# Patient Record
Sex: Female | Born: 1945 | Race: White | Hispanic: No | Marital: Married | Smoking: Never smoker
Health system: Southern US, Community
[De-identification: ages and names within clinical notes are randomized; demographics above are authoritative.]

## PROBLEM LIST (undated history)

## (undated) HISTORY — PX: BREAST CYST ASPIRATION: SHX578

---

## 2004-10-25 ENCOUNTER — Ambulatory Visit: Payer: Self-pay | Admitting: Family Medicine

## 2005-10-29 ENCOUNTER — Ambulatory Visit: Payer: Self-pay | Admitting: Family Medicine

## 2007-02-04 ENCOUNTER — Ambulatory Visit: Payer: Self-pay | Admitting: Family Medicine

## 2007-04-20 ENCOUNTER — Ambulatory Visit: Payer: Self-pay | Admitting: Gastroenterology

## 2008-03-02 ENCOUNTER — Ambulatory Visit: Payer: Self-pay | Admitting: Family Medicine

## 2009-03-06 ENCOUNTER — Ambulatory Visit: Payer: Self-pay | Admitting: Family Medicine

## 2010-03-07 ENCOUNTER — Ambulatory Visit: Payer: Self-pay | Admitting: Family Medicine

## 2011-03-27 ENCOUNTER — Ambulatory Visit: Payer: Self-pay | Admitting: Family Medicine

## 2012-04-01 ENCOUNTER — Ambulatory Visit: Payer: Self-pay | Admitting: Family Medicine

## 2012-09-21 ENCOUNTER — Ambulatory Visit: Payer: Self-pay | Admitting: Gastroenterology

## 2012-09-22 LAB — PATHOLOGY REPORT

## 2012-12-29 ENCOUNTER — Ambulatory Visit: Payer: Self-pay | Admitting: Family Medicine

## 2013-04-02 ENCOUNTER — Ambulatory Visit: Payer: Self-pay | Admitting: Family Medicine

## 2014-04-04 ENCOUNTER — Ambulatory Visit: Payer: Self-pay | Admitting: Family Medicine

## 2014-04-04 DIAGNOSIS — Z1231 Encounter for screening mammogram for malignant neoplasm of breast: Secondary | ICD-10-CM | POA: Diagnosis not present

## 2014-06-09 DIAGNOSIS — M545 Low back pain: Secondary | ICD-10-CM | POA: Diagnosis not present

## 2014-06-30 DIAGNOSIS — R05 Cough: Secondary | ICD-10-CM | POA: Diagnosis not present

## 2014-08-09 DIAGNOSIS — E784 Other hyperlipidemia: Secondary | ICD-10-CM | POA: Diagnosis not present

## 2014-08-09 DIAGNOSIS — I10 Essential (primary) hypertension: Secondary | ICD-10-CM | POA: Diagnosis not present

## 2014-08-10 DIAGNOSIS — N390 Urinary tract infection, site not specified: Secondary | ICD-10-CM | POA: Diagnosis not present

## 2014-08-16 DIAGNOSIS — I10 Essential (primary) hypertension: Secondary | ICD-10-CM | POA: Diagnosis not present

## 2014-08-16 DIAGNOSIS — E785 Hyperlipidemia, unspecified: Secondary | ICD-10-CM | POA: Diagnosis not present

## 2014-08-16 DIAGNOSIS — Z0000 Encounter for general adult medical examination without abnormal findings: Secondary | ICD-10-CM | POA: Diagnosis not present

## 2015-03-27 ENCOUNTER — Other Ambulatory Visit: Payer: Self-pay | Admitting: Family Medicine

## 2015-03-27 DIAGNOSIS — Z1231 Encounter for screening mammogram for malignant neoplasm of breast: Secondary | ICD-10-CM

## 2015-04-05 ENCOUNTER — Ambulatory Visit
Admission: RE | Admit: 2015-04-05 | Discharge: 2015-04-05 | Disposition: A | Discharge status: Home / Self Care | Payer: Commercial Managed Care - HMO | Source: Ambulatory Visit | Attending: Family Medicine | Admitting: Family Medicine

## 2015-04-05 DIAGNOSIS — Z1231 Encounter for screening mammogram for malignant neoplasm of breast: Secondary | ICD-10-CM | POA: Insufficient documentation

## 2015-04-27 DIAGNOSIS — R300 Dysuria: Secondary | ICD-10-CM | POA: Diagnosis not present

## 2015-06-07 DIAGNOSIS — R300 Dysuria: Secondary | ICD-10-CM | POA: Diagnosis not present

## 2015-08-08 DIAGNOSIS — E785 Hyperlipidemia, unspecified: Secondary | ICD-10-CM | POA: Diagnosis not present

## 2015-08-08 DIAGNOSIS — I10 Essential (primary) hypertension: Secondary | ICD-10-CM | POA: Diagnosis not present

## 2015-08-08 DIAGNOSIS — Z0000 Encounter for general adult medical examination without abnormal findings: Secondary | ICD-10-CM | POA: Diagnosis not present

## 2015-08-09 DIAGNOSIS — N390 Urinary tract infection, site not specified: Secondary | ICD-10-CM | POA: Diagnosis not present

## 2015-10-12 DIAGNOSIS — Z0000 Encounter for general adult medical examination without abnormal findings: Secondary | ICD-10-CM | POA: Diagnosis not present

## 2015-12-26 DIAGNOSIS — R300 Dysuria: Secondary | ICD-10-CM | POA: Diagnosis not present

## 2016-02-27 ENCOUNTER — Other Ambulatory Visit: Payer: Self-pay | Admitting: Family Medicine

## 2016-02-27 DIAGNOSIS — Z1231 Encounter for screening mammogram for malignant neoplasm of breast: Secondary | ICD-10-CM

## 2016-04-05 ENCOUNTER — Ambulatory Visit
Admission: RE | Admit: 2016-04-05 | Discharge: 2016-04-05 | Disposition: A | Discharge status: Home / Self Care | Payer: Commercial Managed Care - HMO | Source: Ambulatory Visit | Attending: Family Medicine | Admitting: Family Medicine

## 2016-04-05 DIAGNOSIS — Z1231 Encounter for screening mammogram for malignant neoplasm of breast: Secondary | ICD-10-CM | POA: Diagnosis not present

## 2016-06-26 DIAGNOSIS — R399 Unspecified symptoms and signs involving the genitourinary system: Secondary | ICD-10-CM | POA: Diagnosis not present

## 2016-06-26 DIAGNOSIS — R350 Frequency of micturition: Secondary | ICD-10-CM | POA: Diagnosis not present

## 2016-07-23 DIAGNOSIS — R300 Dysuria: Secondary | ICD-10-CM | POA: Diagnosis not present

## 2016-10-10 DIAGNOSIS — Z0000 Encounter for general adult medical examination without abnormal findings: Secondary | ICD-10-CM | POA: Diagnosis not present

## 2016-10-11 DIAGNOSIS — R3989 Other symptoms and signs involving the genitourinary system: Secondary | ICD-10-CM | POA: Diagnosis not present

## 2016-10-17 DIAGNOSIS — I10 Essential (primary) hypertension: Secondary | ICD-10-CM | POA: Diagnosis not present

## 2016-10-17 DIAGNOSIS — E785 Hyperlipidemia, unspecified: Secondary | ICD-10-CM | POA: Diagnosis not present

## 2016-10-17 DIAGNOSIS — Z0000 Encounter for general adult medical examination without abnormal findings: Secondary | ICD-10-CM | POA: Diagnosis not present

## 2016-11-22 DIAGNOSIS — Z86010 Personal history of colonic polyps: Secondary | ICD-10-CM | POA: Diagnosis not present

## 2016-12-27 DIAGNOSIS — K640 First degree hemorrhoids: Secondary | ICD-10-CM | POA: Diagnosis not present

## 2016-12-27 DIAGNOSIS — K5730 Diverticulosis of large intestine without perforation or abscess without bleeding: Secondary | ICD-10-CM | POA: Diagnosis not present

## 2016-12-27 DIAGNOSIS — Z86010 Personal history of colonic polyps: Secondary | ICD-10-CM | POA: Diagnosis not present

## 2016-12-27 DIAGNOSIS — D120 Benign neoplasm of cecum: Secondary | ICD-10-CM | POA: Diagnosis not present

## 2016-12-27 DIAGNOSIS — D126 Benign neoplasm of colon, unspecified: Secondary | ICD-10-CM | POA: Diagnosis not present

## 2016-12-27 DIAGNOSIS — K648 Other hemorrhoids: Secondary | ICD-10-CM | POA: Diagnosis not present

## 2016-12-27 DIAGNOSIS — K635 Polyp of colon: Secondary | ICD-10-CM | POA: Diagnosis not present

## 2017-03-18 ENCOUNTER — Other Ambulatory Visit: Payer: Self-pay | Admitting: Family Medicine

## 2017-03-18 DIAGNOSIS — Z1231 Encounter for screening mammogram for malignant neoplasm of breast: Secondary | ICD-10-CM

## 2017-04-08 ENCOUNTER — Ambulatory Visit
Admission: RE | Admit: 2017-04-08 | Discharge: 2017-04-08 | Disposition: A | Discharge status: Home / Self Care | Payer: Medicare HMO | Source: Ambulatory Visit | Attending: Family Medicine | Admitting: Family Medicine

## 2017-04-08 DIAGNOSIS — Z1231 Encounter for screening mammogram for malignant neoplasm of breast: Secondary | ICD-10-CM | POA: Diagnosis not present

## 2017-04-17 DIAGNOSIS — H2513 Age-related nuclear cataract, bilateral: Secondary | ICD-10-CM | POA: Diagnosis not present

## 2017-05-23 DIAGNOSIS — N390 Urinary tract infection, site not specified: Secondary | ICD-10-CM | POA: Diagnosis not present

## 2017-05-23 DIAGNOSIS — A499 Bacterial infection, unspecified: Secondary | ICD-10-CM | POA: Diagnosis not present

## 2017-05-23 DIAGNOSIS — R350 Frequency of micturition: Secondary | ICD-10-CM | POA: Diagnosis not present

## 2017-10-17 DIAGNOSIS — I10 Essential (primary) hypertension: Secondary | ICD-10-CM | POA: Diagnosis not present

## 2017-10-17 DIAGNOSIS — E785 Hyperlipidemia, unspecified: Secondary | ICD-10-CM | POA: Diagnosis not present

## 2017-10-21 DIAGNOSIS — Z636 Dependent relative needing care at home: Secondary | ICD-10-CM | POA: Diagnosis not present

## 2017-10-21 DIAGNOSIS — Z0000 Encounter for general adult medical examination without abnormal findings: Secondary | ICD-10-CM | POA: Diagnosis not present

## 2017-12-04 DIAGNOSIS — Z636 Dependent relative needing care at home: Secondary | ICD-10-CM | POA: Diagnosis not present

## 2017-12-04 DIAGNOSIS — F438 Other reactions to severe stress: Secondary | ICD-10-CM | POA: Diagnosis not present

## 2018-03-23 ENCOUNTER — Other Ambulatory Visit: Payer: Self-pay | Admitting: Family Medicine

## 2018-03-23 DIAGNOSIS — Z1231 Encounter for screening mammogram for malignant neoplasm of breast: Secondary | ICD-10-CM

## 2018-06-01 ENCOUNTER — Ambulatory Visit
Admission: RE | Admit: 2018-06-01 | Discharge: 2018-06-01 | Disposition: A | Discharge status: Home / Self Care | Payer: Medicare HMO | Source: Ambulatory Visit | Attending: Family Medicine | Admitting: Family Medicine

## 2018-06-01 ENCOUNTER — Other Ambulatory Visit: Payer: Self-pay

## 2018-06-01 DIAGNOSIS — Z1231 Encounter for screening mammogram for malignant neoplasm of breast: Secondary | ICD-10-CM | POA: Diagnosis not present

## 2018-06-10 ENCOUNTER — Other Ambulatory Visit: Payer: Self-pay | Admitting: Family Medicine

## 2018-06-10 DIAGNOSIS — N6320 Unspecified lump in the left breast, unspecified quadrant: Secondary | ICD-10-CM

## 2018-06-10 DIAGNOSIS — N6310 Unspecified lump in the right breast, unspecified quadrant: Secondary | ICD-10-CM

## 2018-06-10 DIAGNOSIS — R928 Other abnormal and inconclusive findings on diagnostic imaging of breast: Secondary | ICD-10-CM

## 2018-06-25 ENCOUNTER — Ambulatory Visit
Admission: RE | Admit: 2018-06-25 | Discharge: 2018-06-25 | Disposition: A | Discharge status: Home / Self Care | Payer: Medicare HMO | Source: Ambulatory Visit | Attending: Family Medicine | Admitting: Family Medicine

## 2018-06-25 ENCOUNTER — Other Ambulatory Visit: Payer: Self-pay

## 2018-06-25 DIAGNOSIS — N6312 Unspecified lump in the right breast, upper inner quadrant: Secondary | ICD-10-CM | POA: Diagnosis not present

## 2018-06-25 DIAGNOSIS — N6310 Unspecified lump in the right breast, unspecified quadrant: Secondary | ICD-10-CM | POA: Diagnosis not present

## 2018-06-25 DIAGNOSIS — N6320 Unspecified lump in the left breast, unspecified quadrant: Secondary | ICD-10-CM

## 2018-06-25 DIAGNOSIS — R928 Other abnormal and inconclusive findings on diagnostic imaging of breast: Secondary | ICD-10-CM | POA: Diagnosis not present

## 2018-06-25 DIAGNOSIS — N6322 Unspecified lump in the left breast, upper inner quadrant: Secondary | ICD-10-CM | POA: Diagnosis not present

## 2018-06-29 ENCOUNTER — Other Ambulatory Visit: Payer: Self-pay | Admitting: Family Medicine

## 2018-06-29 DIAGNOSIS — N6310 Unspecified lump in the right breast, unspecified quadrant: Secondary | ICD-10-CM

## 2018-06-29 DIAGNOSIS — R928 Other abnormal and inconclusive findings on diagnostic imaging of breast: Secondary | ICD-10-CM

## 2018-07-02 ENCOUNTER — Other Ambulatory Visit: Payer: Self-pay

## 2018-07-02 ENCOUNTER — Ambulatory Visit
Admission: RE | Admit: 2018-07-02 | Discharge: 2018-07-02 | Disposition: A | Discharge status: Home / Self Care | Payer: Medicare HMO | Source: Ambulatory Visit | Attending: Family Medicine | Admitting: Family Medicine

## 2018-07-02 DIAGNOSIS — N6310 Unspecified lump in the right breast, unspecified quadrant: Secondary | ICD-10-CM

## 2018-07-02 DIAGNOSIS — N641 Fat necrosis of breast: Secondary | ICD-10-CM | POA: Diagnosis not present

## 2018-07-02 DIAGNOSIS — R928 Other abnormal and inconclusive findings on diagnostic imaging of breast: Secondary | ICD-10-CM | POA: Diagnosis not present

## 2018-07-02 DIAGNOSIS — N6312 Unspecified lump in the right breast, upper inner quadrant: Secondary | ICD-10-CM | POA: Diagnosis not present

## 2018-07-02 HISTORY — PX: BREAST BIOPSY: SHX20

## 2018-07-03 LAB — SURGICAL PATHOLOGY

## 2018-12-23 DIAGNOSIS — I10 Essential (primary) hypertension: Secondary | ICD-10-CM | POA: Diagnosis not present

## 2018-12-23 DIAGNOSIS — Z0000 Encounter for general adult medical examination without abnormal findings: Secondary | ICD-10-CM | POA: Diagnosis not present

## 2018-12-23 DIAGNOSIS — E785 Hyperlipidemia, unspecified: Secondary | ICD-10-CM | POA: Diagnosis not present

## 2018-12-30 DIAGNOSIS — E785 Hyperlipidemia, unspecified: Secondary | ICD-10-CM | POA: Diagnosis not present

## 2018-12-30 DIAGNOSIS — R8290 Unspecified abnormal findings in urine: Secondary | ICD-10-CM | POA: Diagnosis not present

## 2018-12-30 DIAGNOSIS — R030 Elevated blood-pressure reading, without diagnosis of hypertension: Secondary | ICD-10-CM | POA: Diagnosis not present

## 2018-12-30 DIAGNOSIS — Z0000 Encounter for general adult medical examination without abnormal findings: Secondary | ICD-10-CM | POA: Diagnosis not present

## 2018-12-30 DIAGNOSIS — F439 Reaction to severe stress, unspecified: Secondary | ICD-10-CM | POA: Diagnosis not present

## 2019-04-12 DIAGNOSIS — N390 Urinary tract infection, site not specified: Secondary | ICD-10-CM | POA: Diagnosis not present

## 2019-04-12 DIAGNOSIS — R399 Unspecified symptoms and signs involving the genitourinary system: Secondary | ICD-10-CM | POA: Diagnosis not present

## 2019-04-12 DIAGNOSIS — R319 Hematuria, unspecified: Secondary | ICD-10-CM | POA: Diagnosis not present

## 2019-04-20 ENCOUNTER — Other Ambulatory Visit: Payer: Self-pay | Admitting: Family Medicine

## 2019-04-20 DIAGNOSIS — Z1231 Encounter for screening mammogram for malignant neoplasm of breast: Secondary | ICD-10-CM

## 2019-06-02 ENCOUNTER — Ambulatory Visit
Admission: RE | Admit: 2019-06-02 | Discharge: 2019-06-02 | Disposition: A | Discharge status: Home / Self Care | Payer: Medicare HMO | Source: Ambulatory Visit | Attending: Family Medicine | Admitting: Family Medicine

## 2019-06-02 DIAGNOSIS — Z1231 Encounter for screening mammogram for malignant neoplasm of breast: Secondary | ICD-10-CM | POA: Diagnosis not present

## 2019-12-28 DIAGNOSIS — Z0000 Encounter for general adult medical examination without abnormal findings: Secondary | ICD-10-CM | POA: Diagnosis not present

## 2019-12-28 DIAGNOSIS — R030 Elevated blood-pressure reading, without diagnosis of hypertension: Secondary | ICD-10-CM | POA: Diagnosis not present

## 2019-12-28 DIAGNOSIS — E785 Hyperlipidemia, unspecified: Secondary | ICD-10-CM | POA: Diagnosis not present

## 2019-12-29 DIAGNOSIS — R8290 Unspecified abnormal findings in urine: Secondary | ICD-10-CM | POA: Diagnosis not present

## 2020-01-03 DIAGNOSIS — I10 Essential (primary) hypertension: Secondary | ICD-10-CM | POA: Diagnosis not present

## 2020-01-03 DIAGNOSIS — R8290 Unspecified abnormal findings in urine: Secondary | ICD-10-CM | POA: Diagnosis not present

## 2020-01-03 DIAGNOSIS — E785 Hyperlipidemia, unspecified: Secondary | ICD-10-CM | POA: Diagnosis not present

## 2020-01-03 DIAGNOSIS — Z0000 Encounter for general adult medical examination without abnormal findings: Secondary | ICD-10-CM | POA: Diagnosis not present

## 2020-03-28 ENCOUNTER — Other Ambulatory Visit: Payer: Self-pay | Admitting: Family Medicine

## 2020-03-28 DIAGNOSIS — Z1231 Encounter for screening mammogram for malignant neoplasm of breast: Secondary | ICD-10-CM

## 2020-05-15 IMAGING — US ULTRASOUND RIGHT BREAST LIMITED
1 series · 8 of 8 positions shown · non-contrast
Comparison: 04/08/2017 and earlier

ACR Breast Density Category a: The breast tissue is almost entirely
fatty.

CLINICAL DATA: Patient returns after screening study for evaluation
of possible mass in the RIGHT breast and a possible mass in the LEFT
breast.

EXAM:
DIGITAL DIAGNOSTIC BILATERAL MAMMOGRAM WITH CAD AND TOMO
ULTRASOUND BILATERAL BREAST

[Series 1: ultrasound right breast limited · 0.08mm/px · 8 of 8 slices shown]
[im 1/8]
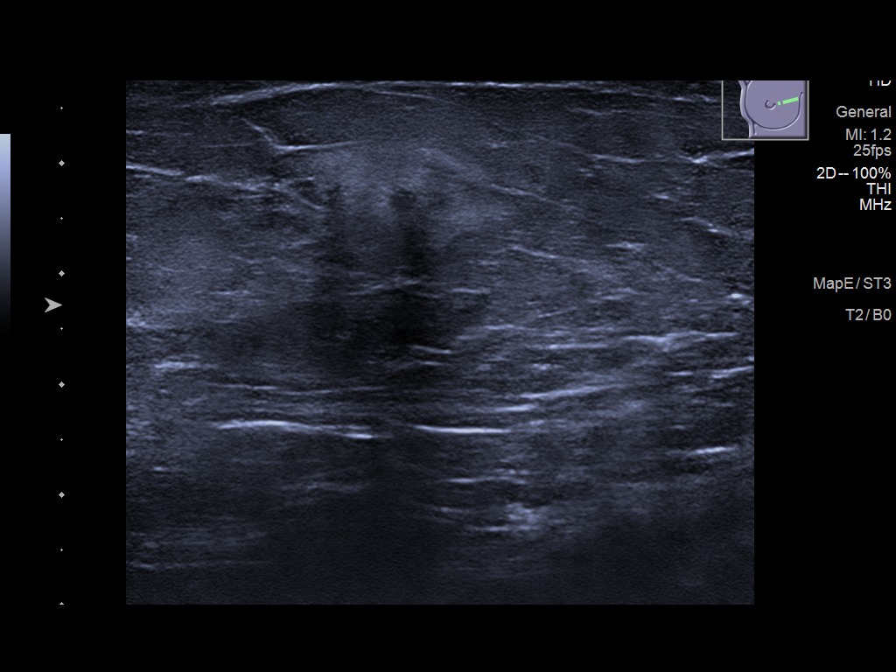
[im 2/8]
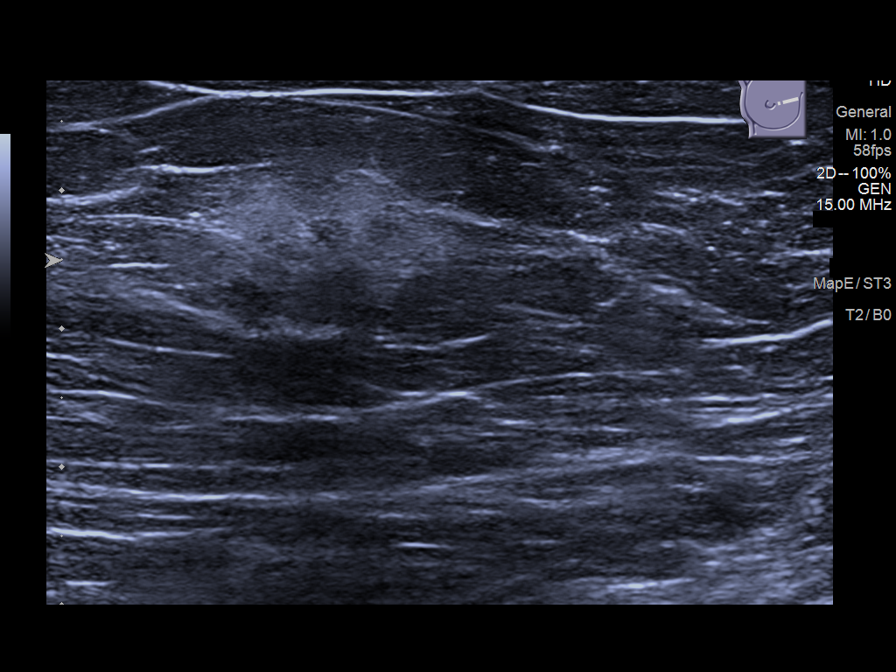
[im 3/8]
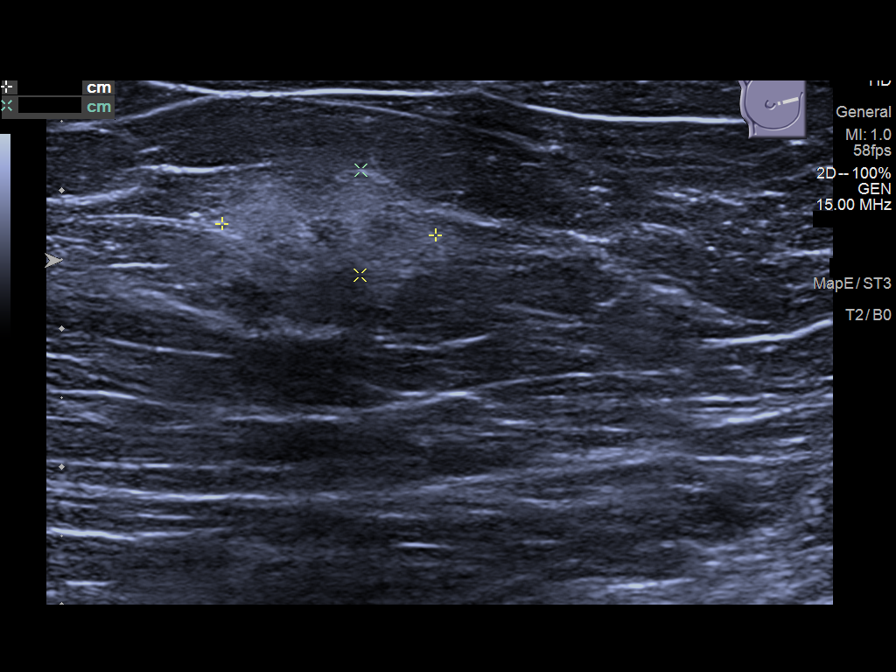
[im 4/8]
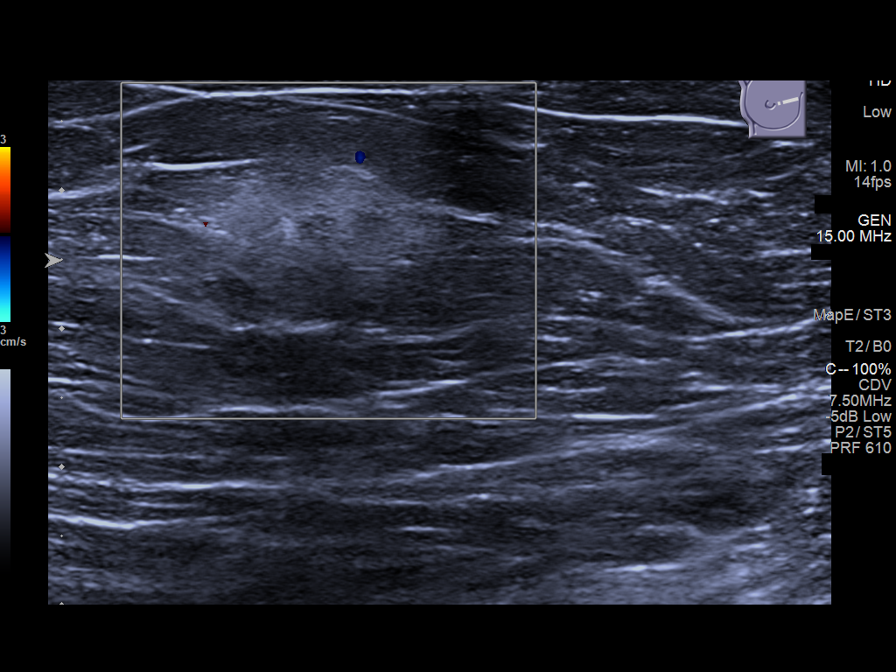
[im 5/8]
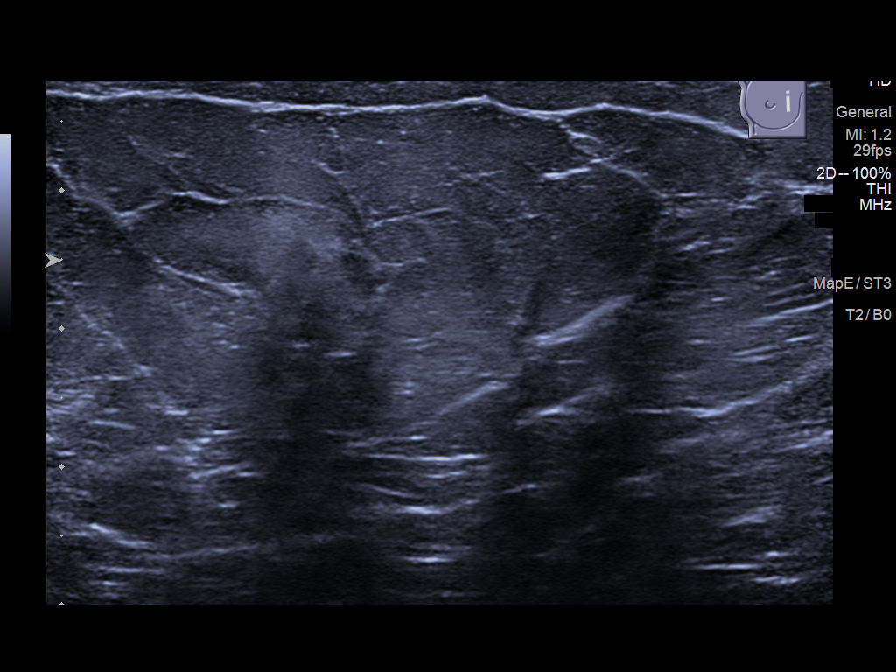
[im 6/8]
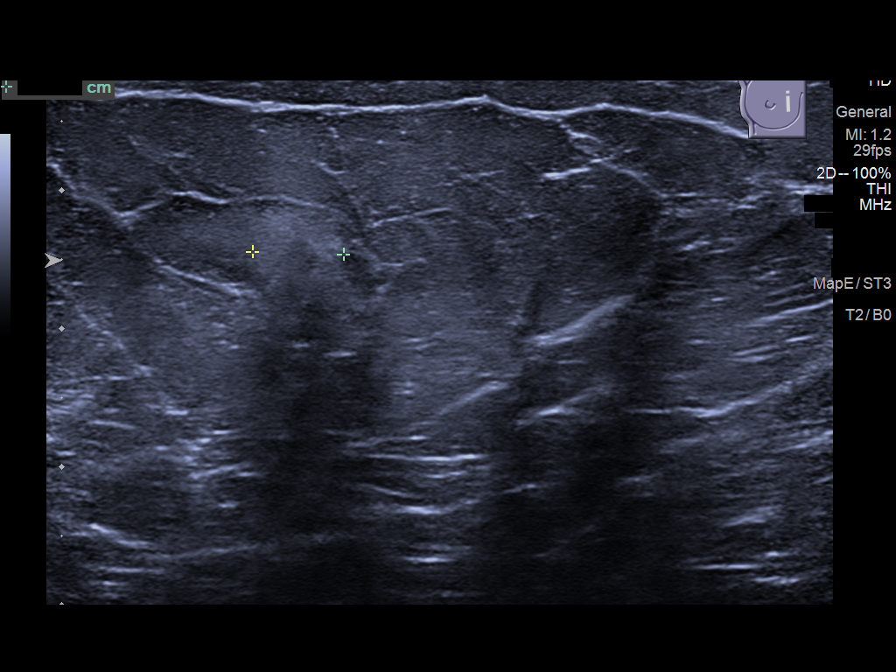
[im 7/8]
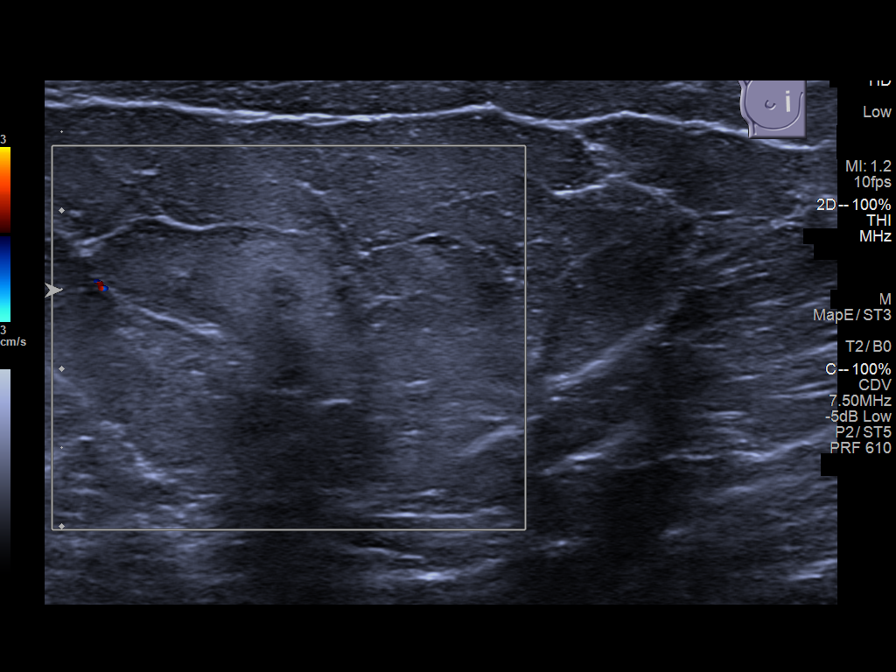
[im 8/8]
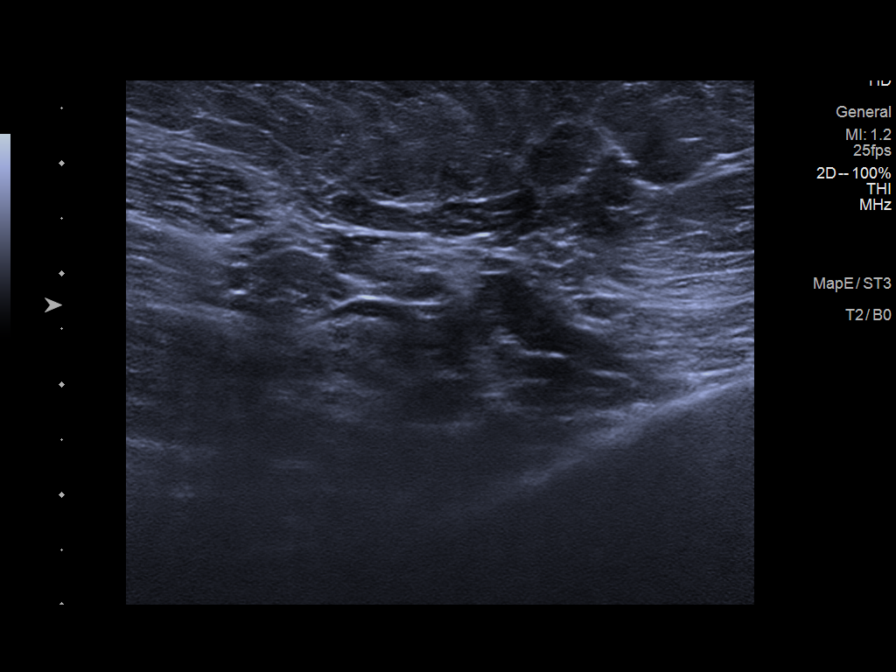

[8 of 8 positions shown; findings below may reference images not displayed]

FINDINGS: Right breast:

Mammogram: Additional 2-D and 3-D images are performed. Spot
compression views confirm presence of an irregular mass with
irregular margins in the MEDIAL portion of the RIGHT breast and
further evaluated with ultrasound. Mammographic images were
processed with CAD.

Physical Exam: There is no visible ecchymosis in the MEDIAL aspect
of the RIGHT breast. Patient denies any known trauma.

Ultrasound: Targeted ultrasound is performed, showing an
irregularly-shaped hyperechoic mass with hypo echoic center and
irregular margins in the 2:30 o'clock location RIGHT breast 9
centimeters from the nipple. There is no significant internal blood
flow. Mass measures 1.6 x 0.8 x 0.7 centimeters and is identified at
1.5 centimeters depth in the breast. Evaluation of the RIGHT axilla
is negative for adenopathy.

Left breast:

Mammogram: Additional 2-D and 3-D images are performed. These views
confirm presence of 2 low-attenuation rounded masses in the MEDIAL
aspect of the LEFT breast, each with lucent center and partial rim
calcification. Mammographic images were processed with CAD.

Ultrasound: Targeted ultrasound is performed, showing a small
hypoechoic oval mass in the 10 o'clock location of the LEFT breast 6
centimeters from the nipple which measures 0.5 centimeters. A second
similar mass is identified in the 10 o'clock location 1 centimeter
from the nipple measuring 0.4 centimeters. No associated internal
blood flow identified with either lesion.
IMPRESSION: 1. Indeterminate mass in the 2:30 o'clock location of the RIGHT
breast, possibly representing fat necrosis. However malignancy is
not excluded and biopsy is indicated.
2. RIGHT axilla is negative.
3. Typical changes of fat necrosis in the 10 o'clock location of the
LEFT breast. No evidence for LEFT breast malignancy.

RECOMMENDATION:
Recommend ultrasound-guided core biopsy of mass in the 2:30 o'clock
location of the RIGHT breast.

I have discussed the findings and recommendations with the patient.
Results were also provided in writing at the conclusion of the
visit. If applicable, a reminder letter will be sent to the patient
regarding the next appointment.

BI-RADS CATEGORY  4: Suspicious.

## 2020-05-15 IMAGING — MG DIGITAL DIAGNOSTIC BILATERAL MAMMOGRAM WITH TOMO AND CAD
6 of 12 series · 6 of 36 positions shown · non-contrast
Comparison: 04/08/2017 and earlier

ACR Breast Density Category a: The breast tissue is almost entirely
fatty.

CLINICAL DATA: Patient returns after screening study for evaluation
of possible mass in the RIGHT breast and a possible mass in the LEFT
breast.

EXAM:
DIGITAL DIAGNOSTIC BILATERAL MAMMOGRAM WITH CAD AND TOMO
ULTRASOUND BILATERAL BREAST

[L ML synth-2D]
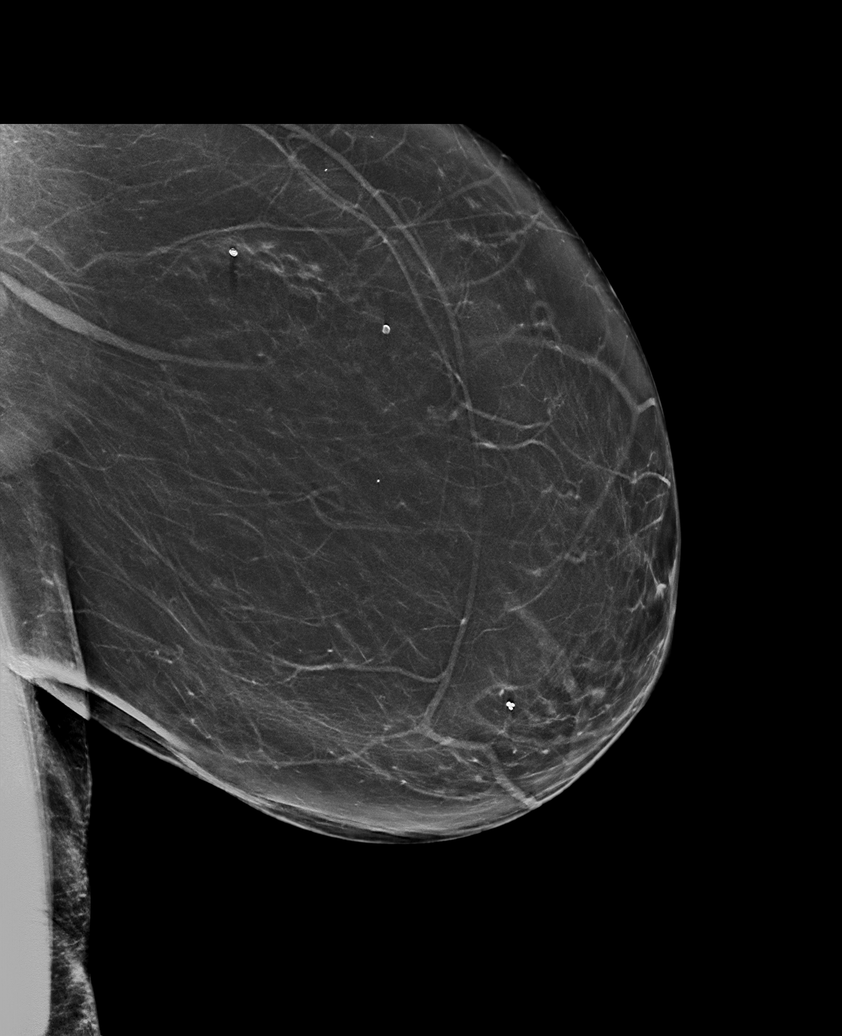

[L CC synth-2D (1 of 3)]
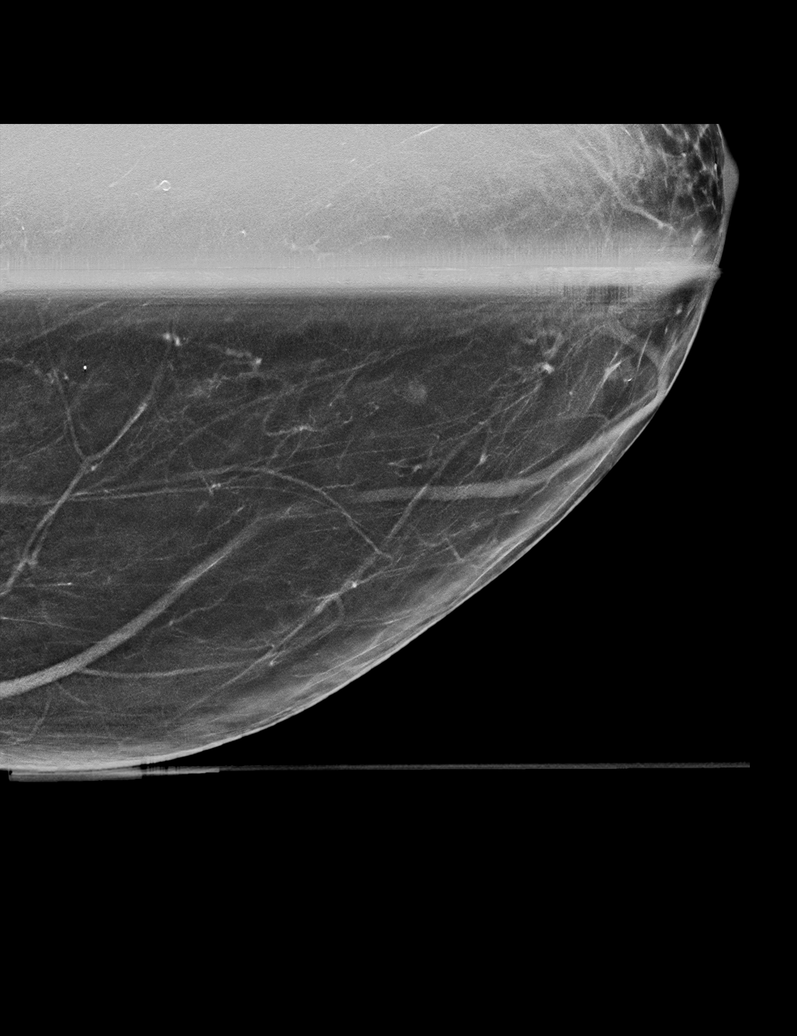

[L CC synth-2D (2 of 3)]
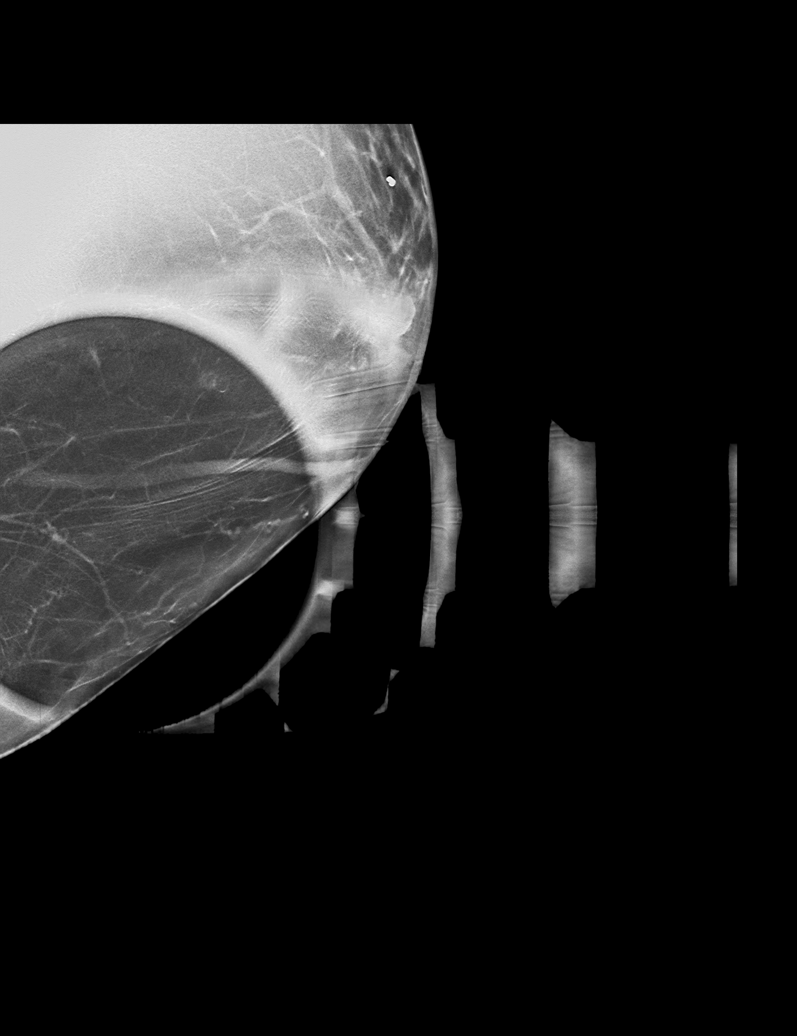

[R CC synth-2D]
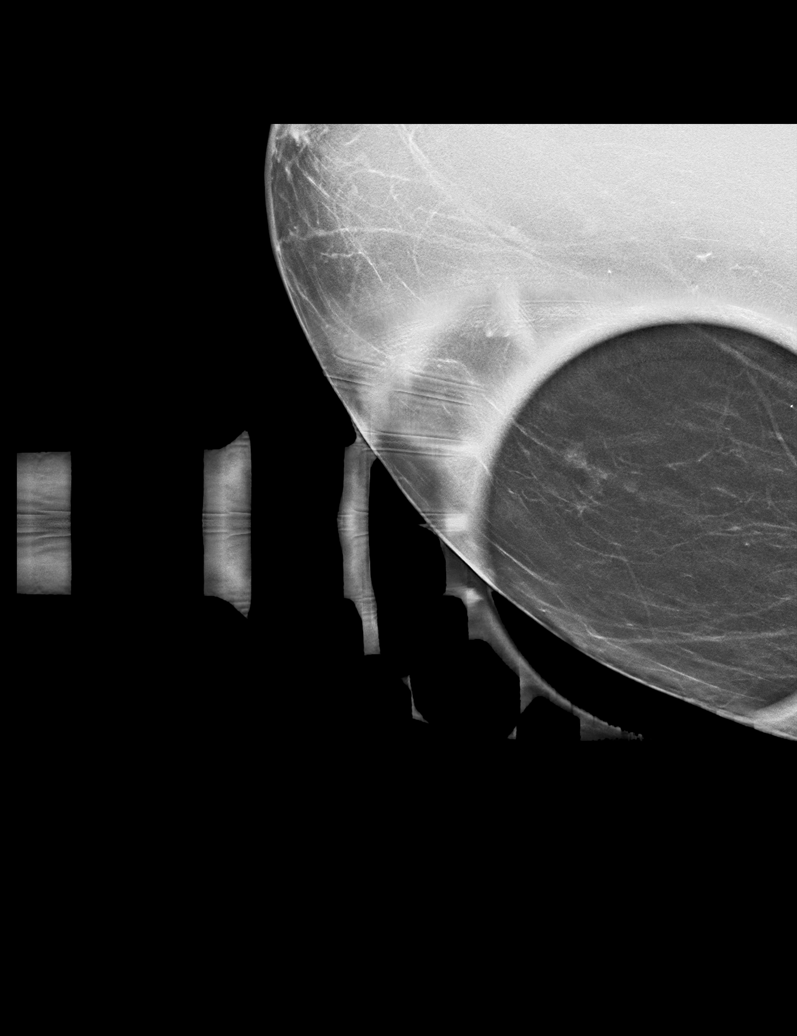

[L CC synth-2D (3 of 3)]
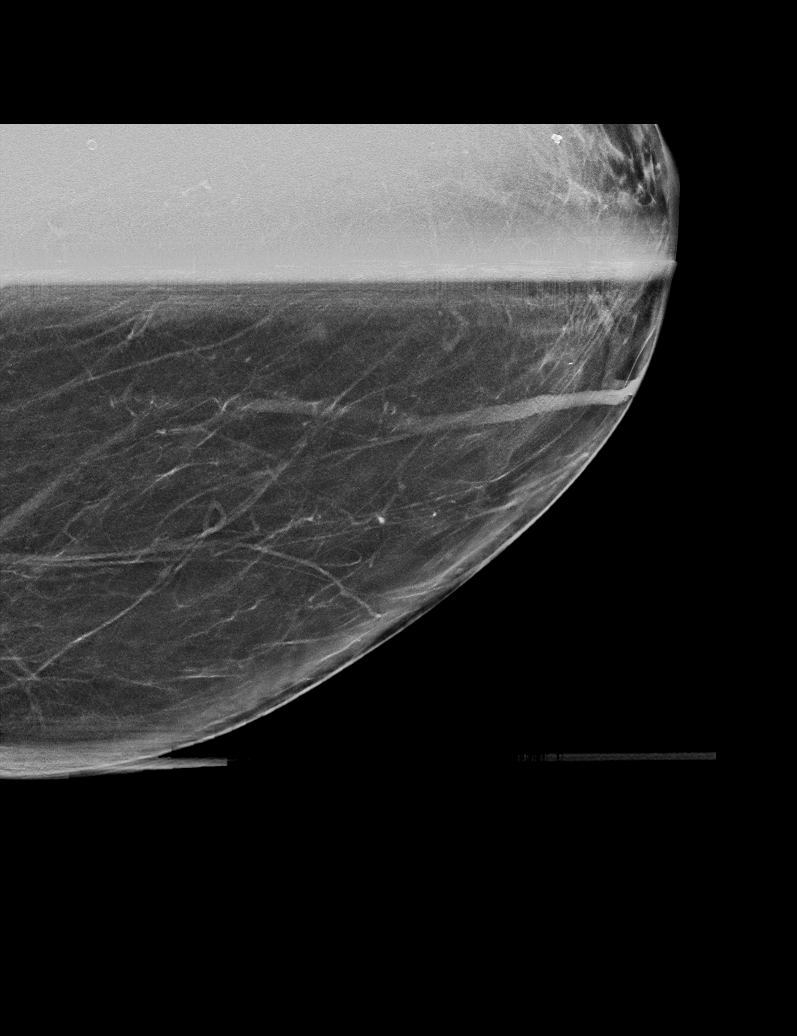

[R ML synth-2D]
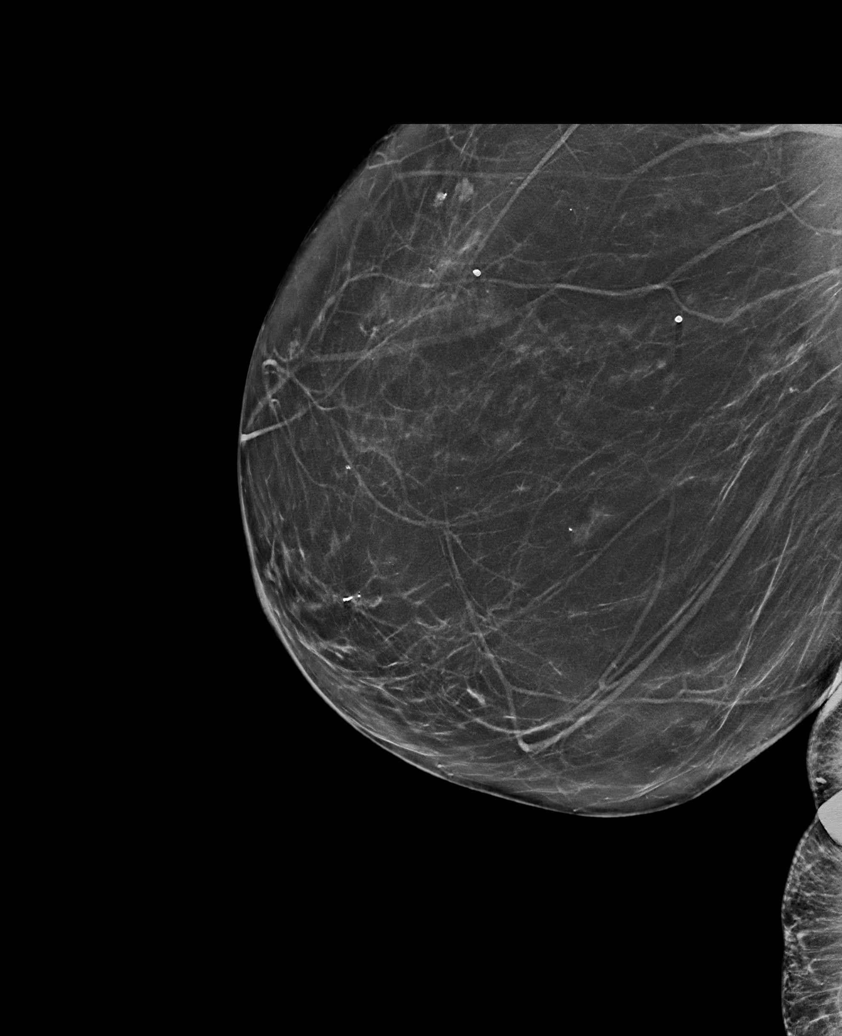

[6 of 36 positions shown; findings below may reference images not displayed]

FINDINGS: Right breast:

Mammogram: Additional 2-D and 3-D images are performed. Spot
compression views confirm presence of an irregular mass with
irregular margins in the MEDIAL portion of the RIGHT breast and
further evaluated with ultrasound. Mammographic images were
processed with CAD.

Physical Exam: There is no visible ecchymosis in the MEDIAL aspect
of the RIGHT breast. Patient denies any known trauma.

Ultrasound: Targeted ultrasound is performed, showing an
irregularly-shaped hyperechoic mass with hypo echoic center and
irregular margins in the 2:30 o'clock location RIGHT breast 9
centimeters from the nipple. There is no significant internal blood
flow. Mass measures 1.6 x 0.8 x 0.7 centimeters and is identified at
1.5 centimeters depth in the breast. Evaluation of the RIGHT axilla
is negative for adenopathy.

Left breast:

Mammogram: Additional 2-D and 3-D images are performed. These views
confirm presence of 2 low-attenuation rounded masses in the MEDIAL
aspect of the LEFT breast, each with lucent center and partial rim
calcification. Mammographic images were processed with CAD.

Ultrasound: Targeted ultrasound is performed, showing a small
hypoechoic oval mass in the 10 o'clock location of the LEFT breast 6
centimeters from the nipple which measures 0.5 centimeters. A second
similar mass is identified in the 10 o'clock location 1 centimeter
from the nipple measuring 0.4 centimeters. No associated internal
blood flow identified with either lesion.
IMPRESSION: 1. Indeterminate mass in the 2:30 o'clock location of the RIGHT
breast, possibly representing fat necrosis. However malignancy is
not excluded and biopsy is indicated.
2. RIGHT axilla is negative.
3. Typical changes of fat necrosis in the 10 o'clock location of the
LEFT breast. No evidence for LEFT breast malignancy.

RECOMMENDATION:
Recommend ultrasound-guided core biopsy of mass in the 2:30 o'clock
location of the RIGHT breast.

I have discussed the findings and recommendations with the patient.
Results were also provided in writing at the conclusion of the
visit. If applicable, a reminder letter will be sent to the patient
regarding the next appointment.

BI-RADS CATEGORY  4: Suspicious.

## 2020-05-22 IMAGING — MG MM CLIP PLACEMENT
2 series · 2 of 2 positions shown · non-contrast
Comparison: Previous exam(s).

CLINICAL DATA: Ultrasound-guided core needle biopsy was performed
of a mass in the upper inner right breast.

EXAM:
DIAGNOSTIC RIGHT MAMMOGRAM POST ULTRASOUND BIOPSY

[R CC]
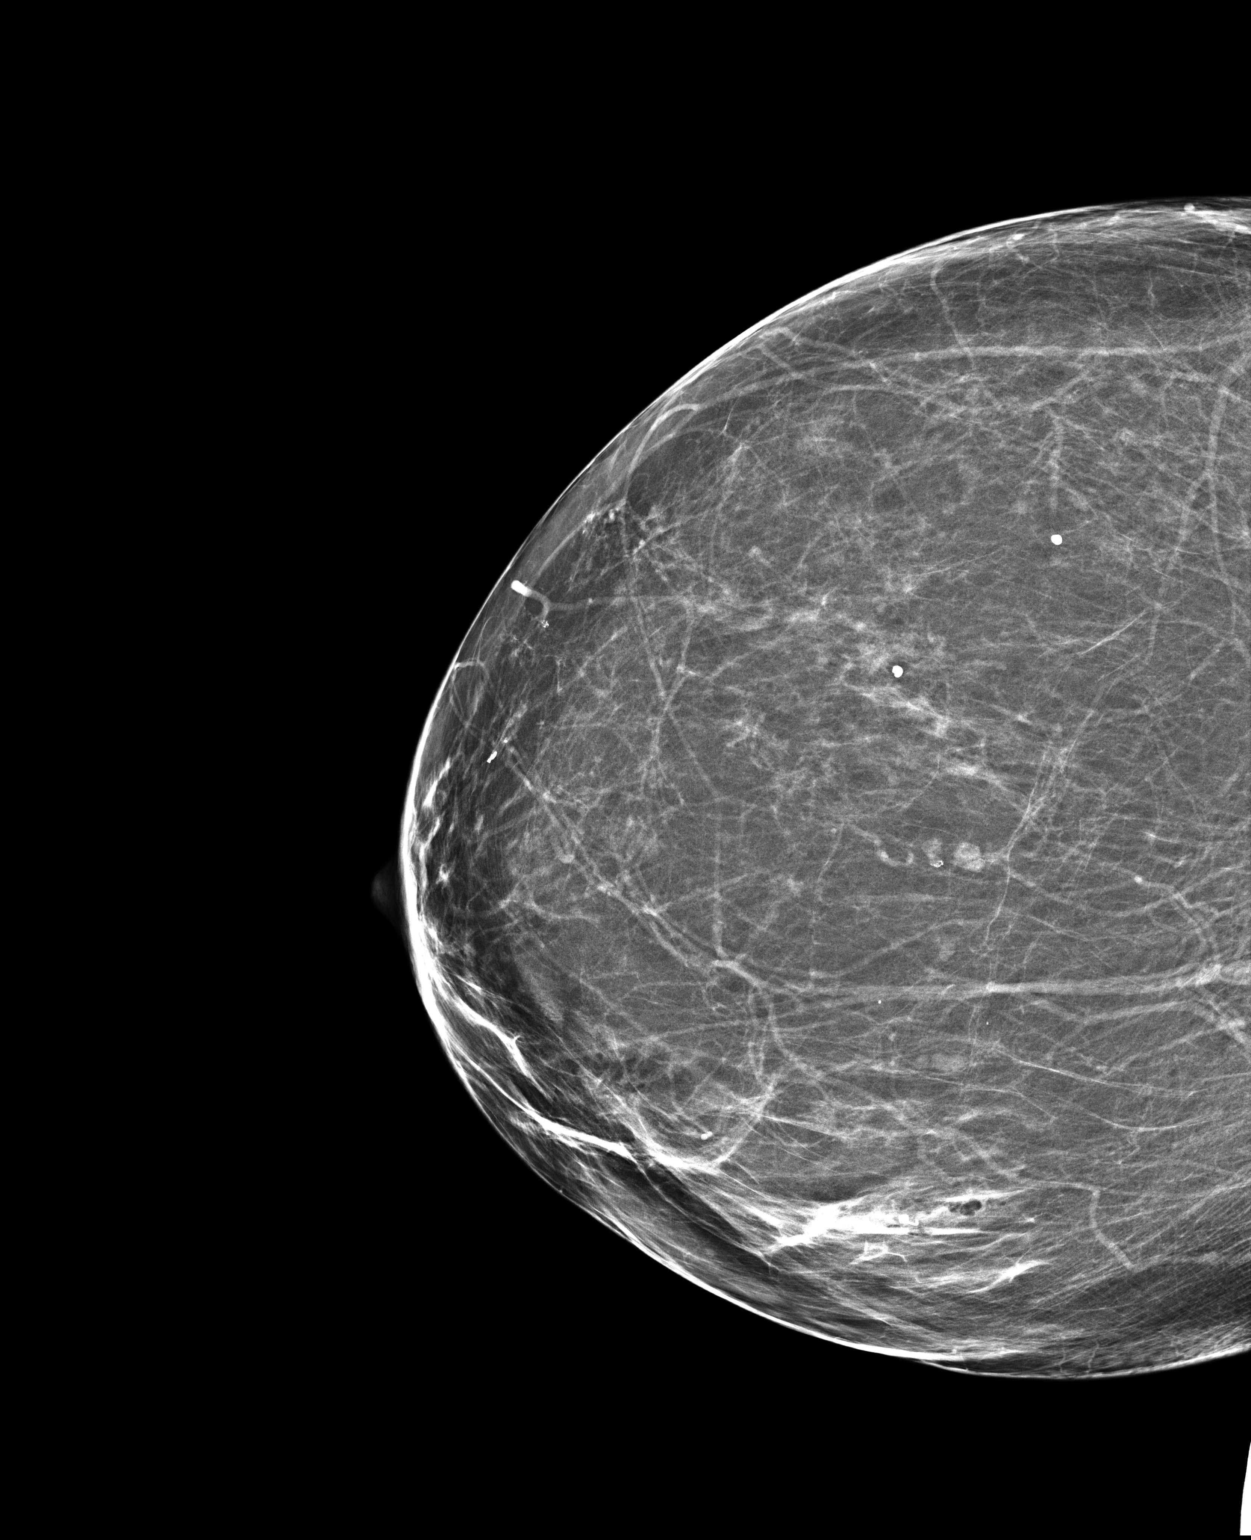

[R ML]
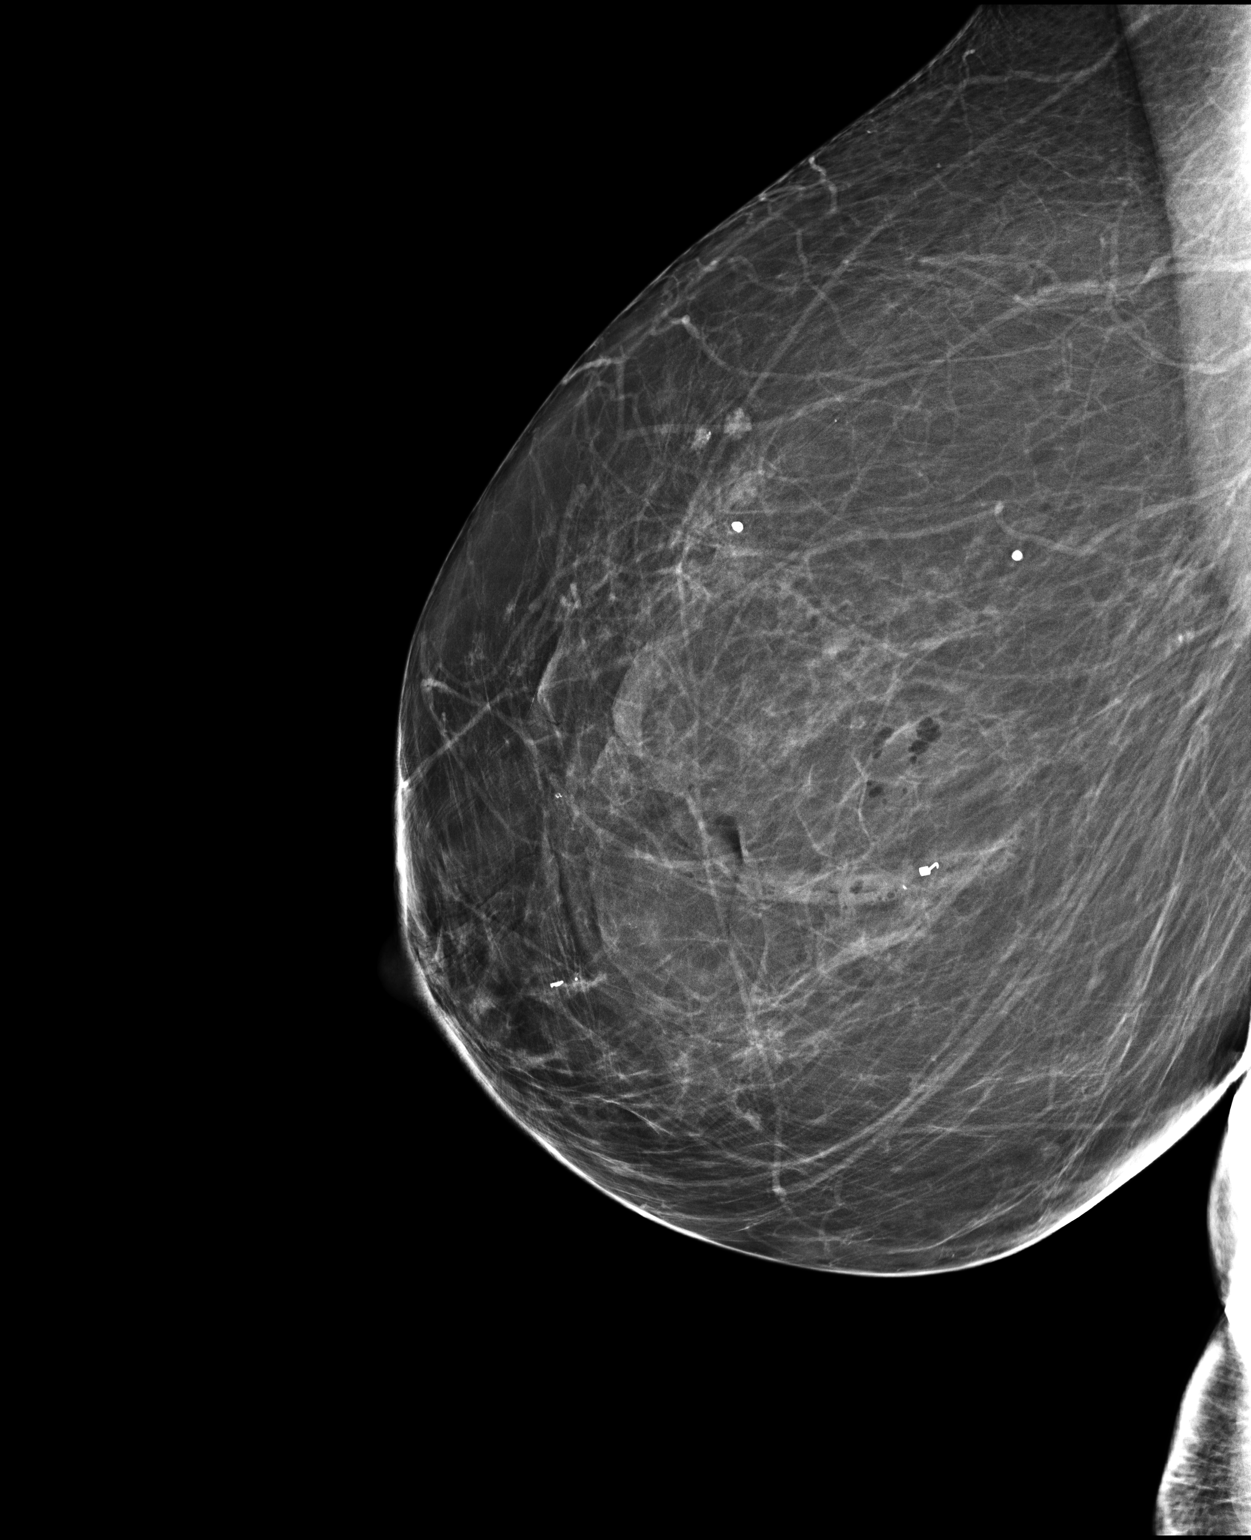

[2 of 2 positions shown; findings below may reference images not displayed]

FINDINGS: Mammographic images were obtained following ultrasound guided biopsy
of a right breast mass at [DATE] position 9 cm from the nipple. A coil
shaped biopsy clip is in satisfactory position.
IMPRESSION: Satisfactory position of coil shaped biopsy clip at the site of the
biopsied mass.

Final Assessment: Post Procedure Mammograms for Marker Placement

## 2020-06-06 ENCOUNTER — Ambulatory Visit
Admission: RE | Admit: 2020-06-06 | Discharge: 2020-06-06 | Disposition: A | Discharge status: Home / Self Care | Payer: Medicare Other | Source: Ambulatory Visit | Attending: Family Medicine | Admitting: Family Medicine

## 2020-06-06 ENCOUNTER — Other Ambulatory Visit: Payer: Self-pay

## 2020-06-06 DIAGNOSIS — Z1231 Encounter for screening mammogram for malignant neoplasm of breast: Secondary | ICD-10-CM | POA: Insufficient documentation

## 2021-02-16 DIAGNOSIS — H353131 Nonexudative age-related macular degeneration, bilateral, early dry stage: Secondary | ICD-10-CM | POA: Diagnosis not present

## 2021-04-27 ENCOUNTER — Other Ambulatory Visit: Payer: Self-pay | Admitting: Family Medicine

## 2021-04-27 DIAGNOSIS — Z1231 Encounter for screening mammogram for malignant neoplasm of breast: Secondary | ICD-10-CM

## 2021-06-07 ENCOUNTER — Ambulatory Visit
Admission: RE | Admit: 2021-06-07 | Discharge: 2021-06-07 | Disposition: A | Discharge status: Home / Self Care | Payer: PPO | Source: Ambulatory Visit | Attending: Family Medicine | Admitting: Family Medicine

## 2021-06-07 DIAGNOSIS — Z1231 Encounter for screening mammogram for malignant neoplasm of breast: Secondary | ICD-10-CM | POA: Diagnosis not present

## 2021-06-08 DIAGNOSIS — H353131 Nonexudative age-related macular degeneration, bilateral, early dry stage: Secondary | ICD-10-CM | POA: Diagnosis not present

## 2022-01-08 DIAGNOSIS — E785 Hyperlipidemia, unspecified: Secondary | ICD-10-CM | POA: Diagnosis not present

## 2022-01-08 DIAGNOSIS — I10 Essential (primary) hypertension: Secondary | ICD-10-CM | POA: Diagnosis not present

## 2022-01-10 DIAGNOSIS — R8290 Unspecified abnormal findings in urine: Secondary | ICD-10-CM | POA: Diagnosis not present

## 2022-01-15 DIAGNOSIS — Z1211 Encounter for screening for malignant neoplasm of colon: Secondary | ICD-10-CM | POA: Diagnosis not present

## 2022-01-15 DIAGNOSIS — Z23 Encounter for immunization: Secondary | ICD-10-CM | POA: Diagnosis not present

## 2022-01-15 DIAGNOSIS — I10 Essential (primary) hypertension: Secondary | ICD-10-CM | POA: Diagnosis not present

## 2022-01-15 DIAGNOSIS — Z0000 Encounter for general adult medical examination without abnormal findings: Secondary | ICD-10-CM | POA: Diagnosis not present

## 2022-01-15 DIAGNOSIS — E785 Hyperlipidemia, unspecified: Secondary | ICD-10-CM | POA: Diagnosis not present

## 2022-01-15 DIAGNOSIS — F439 Reaction to severe stress, unspecified: Secondary | ICD-10-CM | POA: Diagnosis not present

## 2022-04-30 DIAGNOSIS — Z86010 Personal history of colonic polyps: Secondary | ICD-10-CM | POA: Diagnosis not present

## 2022-04-30 DIAGNOSIS — Z01818 Encounter for other preprocedural examination: Secondary | ICD-10-CM | POA: Diagnosis not present

## 2022-05-01 ENCOUNTER — Other Ambulatory Visit: Payer: Self-pay | Admitting: Family Medicine

## 2022-05-01 DIAGNOSIS — Z1231 Encounter for screening mammogram for malignant neoplasm of breast: Secondary | ICD-10-CM

## 2022-05-23 ENCOUNTER — Ambulatory Visit: Payer: PPO

## 2022-05-23 DIAGNOSIS — D125 Benign neoplasm of sigmoid colon: Secondary | ICD-10-CM | POA: Diagnosis not present

## 2022-05-23 DIAGNOSIS — K5730 Diverticulosis of large intestine without perforation or abscess without bleeding: Secondary | ICD-10-CM | POA: Diagnosis not present

## 2022-05-23 DIAGNOSIS — K640 First degree hemorrhoids: Secondary | ICD-10-CM | POA: Diagnosis not present

## 2022-05-23 DIAGNOSIS — Z86010 Personal history of colonic polyps: Secondary | ICD-10-CM | POA: Diagnosis not present

## 2022-05-23 DIAGNOSIS — Z09 Encounter for follow-up examination after completed treatment for conditions other than malignant neoplasm: Secondary | ICD-10-CM | POA: Diagnosis not present

## 2022-06-05 DIAGNOSIS — K1121 Acute sialoadenitis: Secondary | ICD-10-CM | POA: Diagnosis not present

## 2022-06-11 ENCOUNTER — Ambulatory Visit
Admission: RE | Admit: 2022-06-11 | Discharge: 2022-06-11 | Disposition: A | Discharge status: Home / Self Care | Payer: PPO | Source: Ambulatory Visit | Attending: Family Medicine | Admitting: Family Medicine

## 2022-06-11 DIAGNOSIS — Z1231 Encounter for screening mammogram for malignant neoplasm of breast: Secondary | ICD-10-CM | POA: Diagnosis not present

## 2022-08-18 ENCOUNTER — Other Ambulatory Visit: Payer: Self-pay

## 2022-08-18 ENCOUNTER — Ambulatory Visit
Admission: EM | Admit: 2022-08-18 | Discharge: 2022-08-18 | Disposition: A | Discharge status: Home / Self Care | Payer: PPO

## 2022-08-18 ENCOUNTER — Encounter: Payer: Self-pay | Admitting: *Deleted

## 2022-08-18 DIAGNOSIS — H6123 Impacted cerumen, bilateral: Secondary | ICD-10-CM | POA: Diagnosis not present

## 2022-08-18 NOTE — ED Provider Notes (Signed)
Renaldo Fiddler    CSN: [id number] Arrival date & time: 08/18/22  1153      History   Chief Complaint Chief Complaint  Patient presents with   Ear Fullness    HPI Allison Snow is a 77 y.o. female with R ear clogged since this am. Has noticed decreased hearing in both for a few weeks. She is prone to getting wax buildup and has to have it lavaged a few times a year. She denies pain in her ears.     History reviewed. No pertinent past medical history.  There are no problems to display for this patient.   Past Surgical History:  Procedure Laterality Date   BREAST BIOPSY Right 07/02/2018   ORGANIZING FAT NECROSIS   BREAST CYST ASPIRATION Right 20+ yrs ago   neg    OB History   No obstetric history on file.      Home Medications    Prior to Admission medications   Not on File    Family History Family History  Problem Relation Age of Onset   Breast cancer Neg Hx     Social History Social History   Tobacco Use   Smoking status: Never   Smokeless tobacco: Never     Allergies   Latex and Penicillins   Review of Systems Review of Systems As noted in HPI  Physical Exam Triage Vital Signs ED Triage Vitals  Encounter Vitals Group     BP 08/18/22 1242 (!) 163/98     Systolic BP Percentile --      Diastolic BP Percentile --      Pulse Rate 08/18/22 1242 (!) 58     Resp 08/18/22 1242 18     Temp 08/18/22 1242 98.3 F (36.8 C)     Temp src --      SpO2 08/18/22 1242 98 %     Weight --      Height --      Head Circumference --      Peak Flow --      Pain Score 08/18/22 1237 0     Pain Loc --      Pain Education --      Exclude from Growth Chart --    No data found.  Updated Vital Signs BP (!) 163/98   Pulse (!) 58   Temp 98.3 F (36.8 C)   Resp 18   SpO2 98%   Visual Acuity Right Eye Distance:   Left Eye Distance:   Bilateral Distance:    Right Eye Near:   Left Eye Near:    Bilateral Near:     Physical Exam   UC  Treatments / Results  Labs (all labs ordered are listed, but only abnormal results are displayed) Labs Reviewed - No data to display  EKG   Radiology No results found.  Procedures Procedures (including critical care time)  Medications Ordered in UC Medications - No data to display  Initial Impression / Assessment and Plan / UC Course  I have reviewed the triage vital signs and the nursing notes. Bilateral ear lavage was attempted, and she could not tolerate it having done on the L. From the R side only a little wax was washed.   Cerumen impaction  Advised to use debrox every day for a week and FU with pcp for lavage in 9 days.    Final Clinical Impressions(s) / UC Diagnoses   Final diagnoses:  Bilateral hearing loss due to cerumen  impaction     Discharge Instructions      Soak your ear canals with Debrox drops to soften the wax left behind and see your primary care provider to have it removed.      ED Prescriptions   None    PDMP not reviewed this encounter.   Garey Ham, PA-C 08/18/22 1326

## 2022-08-18 NOTE — ED Triage Notes (Signed)
Pt reports she has decreased hearing because her ears are full.

## 2022-08-18 NOTE — Discharge Instructions (Signed)
Soak your ear canals with Debrox drops to soften the wax left behind and see your primary care provider to have it removed.

## 2023-02-17 DIAGNOSIS — M170 Bilateral primary osteoarthritis of knee: Secondary | ICD-10-CM | POA: Diagnosis not present

## 2023-03-03 DIAGNOSIS — M170 Bilateral primary osteoarthritis of knee: Secondary | ICD-10-CM | POA: Diagnosis not present

## 2023-03-17 DIAGNOSIS — I10 Essential (primary) hypertension: Secondary | ICD-10-CM | POA: Diagnosis not present

## 2023-03-17 DIAGNOSIS — E785 Hyperlipidemia, unspecified: Secondary | ICD-10-CM | POA: Diagnosis not present

## 2023-03-20 DIAGNOSIS — R8290 Unspecified abnormal findings in urine: Secondary | ICD-10-CM | POA: Diagnosis not present

## 2023-03-24 DIAGNOSIS — G8929 Other chronic pain: Secondary | ICD-10-CM | POA: Diagnosis not present

## 2023-03-24 DIAGNOSIS — H6123 Impacted cerumen, bilateral: Secondary | ICD-10-CM | POA: Diagnosis not present

## 2023-03-24 DIAGNOSIS — I10 Essential (primary) hypertension: Secondary | ICD-10-CM | POA: Diagnosis not present

## 2023-03-24 DIAGNOSIS — N390 Urinary tract infection, site not specified: Secondary | ICD-10-CM | POA: Diagnosis not present

## 2023-03-24 DIAGNOSIS — Z0000 Encounter for general adult medical examination without abnormal findings: Secondary | ICD-10-CM | POA: Diagnosis not present

## 2023-03-24 DIAGNOSIS — E785 Hyperlipidemia, unspecified: Secondary | ICD-10-CM | POA: Diagnosis not present

## 2023-03-24 DIAGNOSIS — F439 Reaction to severe stress, unspecified: Secondary | ICD-10-CM | POA: Diagnosis not present

## 2023-03-24 DIAGNOSIS — M25561 Pain in right knee: Secondary | ICD-10-CM | POA: Diagnosis not present

## 2023-03-24 DIAGNOSIS — Z636 Dependent relative needing care at home: Secondary | ICD-10-CM | POA: Diagnosis not present

## 2023-03-26 DIAGNOSIS — M25561 Pain in right knee: Secondary | ICD-10-CM | POA: Diagnosis not present

## 2023-03-26 DIAGNOSIS — G8929 Other chronic pain: Secondary | ICD-10-CM | POA: Diagnosis not present

## 2023-03-26 DIAGNOSIS — M1711 Unilateral primary osteoarthritis, right knee: Secondary | ICD-10-CM | POA: Diagnosis not present

## 2023-03-28 DIAGNOSIS — H6123 Impacted cerumen, bilateral: Secondary | ICD-10-CM | POA: Diagnosis not present

## 2023-04-23 DIAGNOSIS — N390 Urinary tract infection, site not specified: Secondary | ICD-10-CM | POA: Diagnosis not present

## 2023-04-24 DIAGNOSIS — M1711 Unilateral primary osteoarthritis, right knee: Secondary | ICD-10-CM | POA: Diagnosis not present

## 2023-05-01 DIAGNOSIS — M1711 Unilateral primary osteoarthritis, right knee: Secondary | ICD-10-CM | POA: Diagnosis not present

## 2023-05-02 ENCOUNTER — Other Ambulatory Visit: Payer: Self-pay | Admitting: Family Medicine

## 2023-05-02 DIAGNOSIS — Z1231 Encounter for screening mammogram for malignant neoplasm of breast: Secondary | ICD-10-CM

## 2023-05-08 DIAGNOSIS — M1711 Unilateral primary osteoarthritis, right knee: Secondary | ICD-10-CM | POA: Diagnosis not present

## 2023-05-14 DIAGNOSIS — Z87440 Personal history of urinary (tract) infections: Secondary | ICD-10-CM | POA: Diagnosis not present

## 2023-06-12 ENCOUNTER — Ambulatory Visit
Admission: RE | Admit: 2023-06-12 | Discharge: 2023-06-12 | Disposition: A | Discharge status: Home / Self Care | Source: Ambulatory Visit | Attending: Family Medicine | Admitting: Family Medicine

## 2023-06-12 DIAGNOSIS — Z1231 Encounter for screening mammogram for malignant neoplasm of breast: Secondary | ICD-10-CM | POA: Insufficient documentation

## 2023-06-12 DIAGNOSIS — M25471 Effusion, right ankle: Secondary | ICD-10-CM | POA: Diagnosis not present

## 2023-06-19 DIAGNOSIS — M25471 Effusion, right ankle: Secondary | ICD-10-CM | POA: Diagnosis not present

## 2023-07-04 DIAGNOSIS — R350 Frequency of micturition: Secondary | ICD-10-CM | POA: Diagnosis not present

## 2023-07-04 DIAGNOSIS — R8290 Unspecified abnormal findings in urine: Secondary | ICD-10-CM | POA: Diagnosis not present
